# Patient Record
Sex: Female | Born: 2000 | Race: White | Hispanic: No | Marital: Single | Smoking: Never smoker
Health system: Southern US, Community
[De-identification: ages and names within clinical notes are randomized; demographics above are authoritative.]

## PROBLEM LIST (undated history)

## (undated) HISTORY — PX: APPENDECTOMY: SHX54

## (undated) NOTE — ED Provider Notes (Signed)
 Formatting of this note is different from the original.   EMERGENCY DEPARTMENT ENCOUNTER    CHIEF COMPLAINT   Chief Complaint  Patient presents with  ? 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 She reports she felt hot and then passed out falling forward. She denies ever having hx of same. Instructor reports she was unconscious for 30 seconds and her eyes were rolling back into her head during the episode.    HISTORY OF PRESENT ILLNESS   Kaitlyn Crosby is a 76 y.o. female who presents to the emergency department with syncopal episode.  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 ?  She states her vision blacked out and then she had a syncopal episode.  Patient denies any focal weakness or numbness.  No chest pain or shortness of breath.  She does not take birth control pills or use any hormonal therapy.  She has never had a syncopal episode previously.  No seizure activity.  She did strike her head on the ground/left side of the face.  No significant swelling or injury.  Loss of consciousness for about 30 seconds.  No neck pain.  She was otherwise healthy without any chronic medical conditions.  Asymptomatic at this time.  Patient states she only had yogurt and milk this morning for breakfast which is not unusual for her.  Left facial pain at the area of impact/gradual onset headache.  She denies any headache prior to the syncopal event.  PAST MEDICAL HISTORY   Past medical history and social history reviewed and verified by me.  History reviewed. No pertinent past medical history.  SURGICAL HISTORY   Past Surgical History:  Procedure Laterality Date  ? APPENDECTOMY     CURRENT MEDICATIONS   Current Facility-Administered Medications  Medication Dose Route Frequency Provider Last Rate Last Admin  ? sodium chloride (NS BOLUS) 0.9 % IV bolus 1,000 mL  1,000 mL  Intravenous Once Clotilda CHRISTELLA Hastings, MD       No current outpatient medications on file.   ALLERGIES   No Known Allergies  FAMILY HISTORY History reviewed. No pertinent family history.  SOCIAL HISTORY   Social History   Socioeconomic History  ? Marital status: Single    Spouse name: None  ? Number of children: None  ? Years of education: None  ? Highest education level: None  Occupational History  ? None  Tobacco Use  ? Smoking status: Never  ? Smokeless tobacco: Never  Vaping Use  ? Vaping Use: None  Substance and Sexual Activity  ? Alcohol use: Yes    Comment: occ  ? Drug use: Never  ? Sexual activity: None  Other Topics Concern  ? None  Social History Narrative  ? None   Social Determinants of Health   Financial Resource Strain: Not on file  Food Insecurity: Not on file  Transportation Needs: Not on file  Social Connections: Not on file  Housing Stability: Not on file   REVIEW OF SYSTEMS   Constitutional:  No fever or chills. Skin:  No rash.   Eyes:  No new vision problems or eye pain.   ENT:  No sore throat or nasal congestion. Cardiovascular:  No chest pain, no palpitations.   Respiratory:  No cough, shortness of breath, or wheezing.   Gastrointestinal:  No abdominal pain, vomiting, or diarrhea   Genitourinary: No  dysuria, no genital complaints.  Musculoskeletal: No lower extremity swelling.  No joint swelling or redness.  Neurologic:  Gradual onset headache after head impact.  No new focal weakness or numbness. Hematologic:  No unusual bruising or bleeding.    PHYSICAL EXAM   VITAL SIGNS:  BP 123/71 (BP Location: Right upper arm, Patient Position: Sitting)   Pulse 83   Temp 99.1 F (37.3 C) (Oral)   Resp 18   Ht 5' 8 (1.727 m)   Wt 175 lb (79.4 kg)   LMP 12/10/2022 (Approximate)   SpO2 100%   BMI 26.61 kg/m   Reviewed. Vital signs stable.  GENERAL:  Well-appearing, nontoxic, pleasant.  In no apparent pain.  No acute respiratory distress.   Ambulatory.  HEAD:  Mild soft tissue swelling of the left cheek without significant ecchymosis or crepitus.  NCAT, No signs of head trauma. EYES:  PERRL bilaterally.  No conjunctival injection.  EOMI.   EARS:  Hearing grossly intact.  MOUTH:  Oropharynx is normal.  No tonsillar enlargement or exudate.  No mucosal lesions.  Uvula midline.  Dry mucous membranes.  NOSE: Patent nares, no discharge. NECK:  Trachea midline.  Supple neck.  No LAD, no JVD, no thyroid enlargement.  No meningismus.   RESPIRATORY:  Good ventilation.  Lungs clear to ascultation bilaterally.  No wheezes, rales, or rhonchi.   CARDIAC:  Regular rate and rhythm.  S1 and S2, without murmurs, gallops, or rubs. VASCULAR:  Peripheral pulses normal, 2+, equal radial pulses. ABDOMEN:  Soft, nontender to palpation.  Benign abdomen.  Nondistended, no pulsatile mass.  No other masses palpated.  No rebound or guarding.   MUSCULOSKELETAL: Extremities without cyanosis.  No edema.  Bilateral calves symmetric and nontender.  No joint effusions. BACK: No CVA tenderness to palpation, no midline ttp of the thoracic or lumbar spine.   NEUROLOGIC EXAM:  Alert and oriented x 4.   No focal sensory or strength deficits.   Speech normal.  Follows commands.   Normal gait.  CN 2-12 intact bilaterally.  No pronator drift.  5/5 strength BUE, 5/5 strength BLE.  SILT throughout.  PSYCHIATRIC:  Affect normal.  No apparent hallucinations.   SKIN:  No rash or other skin lesions.  EKG: Interpreted and read by me. See MUSE documentation for details.   ECG Results     ECG 12 lead (Final result)     Collection Time Result Time Ventricular Rate Atrial Rate P-R Interval QRS Duration Q-T Interval   01/15/23 10:51:42 01/15/23 10:57:35 67 67 172 88 382    Collection Time Result Time QTC Calculation(Bezet) Calculated P Axis Calculated R Axis Calculated T Axis   01/15/23 10:51:42 01/15/23 10:57:35 403 40 90 56      Final result       Narrative:   Normal  sinus rhythm Rightward axis Borderline ECG No previous ECGs available NSR, incomplete RBBB -Stacy MD Confirmed by Glade Kirsch ([id number]) on 01/15/2023 10:57:30 AM          CARDIAC MONITOR: Patient was placed on cardiac monitor.  Independently interpreted as: Regular, rate in the 60s  LABORATORY & RADIOLOGY STUDIES: Results for orders placed or performed during the hospital encounter of 01/15/23  CBC w/o Differential Panel  Result Value Ref Range   WBC Count 7.8 4.0 - 10.0 K/uL   RBC Count 4.50 3.93 - 5.22 M/uL   Hemoglobin 12.9 11.2 - 15.7 g/dL   Hematocrit 60.0 65.8 - 44.9 %   MCV 88.7 79.4 -  94.8 fL   MCH 28.7 25.6 - 32.2 pg   MCHC 32.3 32.2 - 36.5 g/dL   RDW 86.1 88.3 - 85.5 %   Platelets 208 163 - 369 K/uL   MPV 10.6 9.4 - 12.4 fL   Abs NRBC 0.01 0.00 - 0.01 K/uL   NRBC 0.0 0.0 - 0.2 per 100 WBC's  Basic Metabolic Profile (BMP): Glucose, BUN, CO2-, Na+, K+, Cl-, Ca, SCr, Anion Gap  Result Value Ref Range   Sodium 139 136 - 145 mmol/L   Potassium 4.4 3.4 - 5.1 mmol/L   Chloride 103 98 - 107 mmol/L   Carbon Dioxide 30.4 20.0 - 31.0 mmol/L   Anion Gap 6    Creatinine 0.77 0.55 - 1.02 mg/dL   GFR, Estimated >=39 fO/fpw/8.26f7   Urea Nitrogen, Blood 15 9 - 23 mg/dL   Glucose 99 74 - 893 mg/dL   Calcium 9.3 8.7 - 89.5 mg/dL  Magnesium  Result Value Ref Range   Magnesium 2.2 1.6 - 2.6 mg/dL  POC Urine Pregnancy  Result Value Ref Range   POC Urine Pregnancy Test Negative - QC acceptable background clear Negative - QC acceptable background clear  ECG 12 lead  Result Value Ref Range   Ventricular Rate 67 BPM   Atrial Rate 67 BPM   P-R Interval 172 ms   QRS Duration 88 ms   Q-T Interval 382 ms   QTC Calculation(Bezet) 403 ms   Calculated P Axis 40 degrees   Calculated R Axis 90 degrees   Calculated T Axis 56 degrees   MEDICATIONS / TREATMENTS ADMINISTERED: Medications  sodium chloride (NS BOLUS) 0.9 % IV bolus 1,000 mL (has no administration in time range)    ED COURSE & MEDICAL DECISION MAKING:   This is a 65 y.o. 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  Typical prodrome.  Patient is neurologically intact.  Mild left facial soft tissue swelling, low suspicion fracture intracranial hemorrhage.  Urine basic labs ordered and she was given IV fluids.  EKG unremarkable.  Low suspicion PE or ectopic pregnancy.  All lab and radiologic studies were independently reviewed and interpreted by me.  Advanced imaging studies (i.e.: CT/US /MRI) interpreted by radiology.  Pertinent findings are as follows:  EKG unremarkable.  HCG negative.  Labs are normal.  Patient remained well-appearing and asymptomatic in the emergency department.  She does not require imaging for head injury at this time, low suspicion fracture or intracranial hemorrhage.  Patient agrees with this plan/shared decision-making.  She remained neurologically Intact.  She is stable for discharge home.  I educated her regarding supportive care, advised her to follow up with her primary doctor within 2-3 days for recheck.  She was given return precautions and agrees with plan for discharge.  DISCHARGE HOME:  The patient (and/or caregiver) was educated with regards to all aspects of care, including results of ED testing, prescription and over-the-counter medication use, and follow-up instructions.  The patient was encouraged to follow up with primary care as appropriate.  All of the patient's questions were answered.  The patient (and/or caregiver) agrees with plan for discharge and was given strict return precautions.  The patient will return to the emergency department immediately if any new or worsening symptoms, or if there are any additional concerns prior to following up with his/her primary care doctor.  Disposition:  DC home  FINAL IMPRESSION:   Final diagnoses:  Vasovagal syncope  Contusion of  face, initial  encounter   New Prescriptions   No medications on file   DIFFERENTIAL DIAGNOSIS:  I have considered but do not suspect at this time: Stroke, vascular dissection, acute coronary syndrome, pulmonary embolus, sepsis, dysrhythmia, myocarditis/endocarditis/pericarditis, acute valvular insufficiency, or other emergent condition. I have considered but do not suspect at this time: subarachnoid hemorrhage, intracranial hemorrhage, meningitis/ encephalitis, vascular dissection, cerebral venous sinus thrombosis, temporal arteritis, acute angle closure glaucoma, discitis, epidural abscess/ spinal infection, acute cerebrovascular accident, or other emergent neurologic condition.  .  Tests considered but not performed:  Head CT deferred under shared decision-making at this time.  Clotilda Hastings M.D.  Portions of this note have been dictated using voice recognition software.  Dictation errors are possible and unintentional.  Please excuse any typographical errors.  Please notify the dino if any discrepancies are noted or if the meaning of any statement is not clear.  Clotilda CHRISTELLA Hastings, MD 01/15/23 1144  Electronically signed by Clotilda CHRISTELLA Hastings, MD at 01/15/2023 11:44 AM EST

---
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
## 2021-05-09 ENCOUNTER — Ambulatory Visit: Payer: 59 | Admitting: Advanced Practice Midwife

## 2021-05-09 ENCOUNTER — Other Ambulatory Visit: Payer: Self-pay

## 2021-05-09 ENCOUNTER — Encounter: Payer: Self-pay | Admitting: Advanced Practice Midwife

## 2021-05-09 VITALS — BP 120/80 | Ht 68.0 in | Wt 169.0 lb

## 2021-05-09 DIAGNOSIS — Z3009 Encounter for other general counseling and advice on contraception: Secondary | ICD-10-CM | POA: Diagnosis not present

## 2021-05-10 NOTE — Progress Notes (Signed)
   Patient ID: Kaitlyn Crosby, female   DOB: [birth year], 20 y.o.   MRN: [id number]  Reason for Consult: Contraception (iud)    Subjective:  Date of Service: 05/09/2021  HPI:  Kaitlyn Crosby is a 20 y.o. female being seen to discuss birth/cycle control options. She is interested in a method that will lighten or stop menstrual periods. She normally has a painful period every 32 days lasting 5-6 days. The first 3 days are heavy. She changes a tampon every 4 hours. She is sexually active and currently uses condoms. She denies concern for STDs. She is a non-smoker. She is generally healthy and has no contraindications to IUD use.  We discussed the various methods and their potential side effects, etc. She chooses a hormonal IUD. I recommended the smaller Kyleena since she is a G0. Product specific information given. She will call when her next period starts.   History reviewed. No pertinent past medical history. History reviewed. No pertinent family history. Past Surgical History:  Procedure Laterality Date  . APPENDECTOMY      Short Social History:  Social History   Tobacco Use  . Smoking status: Never Smoker  . Smokeless tobacco: Never Used  Substance Use Topics  . Alcohol use: Never    Not on File  No current outpatient medications on file.   No current facility-administered medications for this visit.    Review of Systems  Constitutional: Negative for chills and fever.  HENT: Negative for congestion, ear discharge, ear pain, hearing loss, sinus pain and sore throat.   Eyes: Negative for blurred vision and double vision.  Respiratory: Negative for cough, shortness of breath and wheezing.   Cardiovascular: Negative for chest pain, palpitations and leg swelling.  Gastrointestinal: Negative for abdominal pain, blood in stool, constipation, diarrhea, heartburn, melena, nausea and vomiting.  Genitourinary: Negative for dysuria, flank pain, frequency, hematuria and urgency.   Musculoskeletal: Negative for back pain, joint pain and myalgias.  Skin: Negative for itching and rash.  Neurological: Negative for dizziness, tingling, tremors, sensory change, speech change, focal weakness, seizures, loss of consciousness, weakness and headaches.  Endo/Heme/Allergies: Negative for environmental allergies. Does not bruise/bleed easily.  Psychiatric/Behavioral: Negative for depression, hallucinations, memory loss, substance abuse and suicidal ideas. The patient is not nervous/anxious and does not have insomnia.         Objective:  Objective   Vitals:   05/09/21 1507  BP: 120/80  Weight: 169 lb (76.7 kg)  Height: 5\' 8"  (1.727 m)   Body mass index is 25.7 kg/m. Constitutional: Well nourished, well developed female in no acute distress.  HEENT: normal Skin: Warm and dry.  Cardiovascular: Regular rate and rhythm.   Extremity: no edema Respiratory: Clear to auscultation bilateral. Normal respiratory effort Neuro: DTRs 2+, Cranial nerves grossly intact Psych: Alert and Oriented x3. No memory deficits. Normal mood and affect.  MS: normal gait, normal bilateral lower extremity ROM/strength/stability.   Assessment/Plan:     20 y.o. G0 P0000 female birth control consult/desire to initiate hormonal birth control 12 IUD)  Call to schedule Geradine Girt IUD insertion when next period starts   Rutha Bouchard CNM [facility] 05/10/2021, 2:19 PM

## 2021-05-31 ENCOUNTER — Telehealth: Payer: Self-pay | Admitting: Advanced Practice Midwife

## 2021-05-31 NOTE — Telephone Encounter (Signed)
Pt is scheduled on 6/7 for Kyleena placement at 8:10 with Tresea Mall.

## 2021-05-31 NOTE — Telephone Encounter (Signed)
Pt aware appt next Tues will be fine.

## 2021-05-31 NOTE — Telephone Encounter (Signed)
Pt calling; wants IUD; started period today; couldn't get appt until next Tues; bleeding will be lighter then; will that be okay.  [phone]  [facility] - yes it will be fine.

## 2021-06-03 NOTE — Telephone Encounter (Signed)
Noted. Kyleena reserved for this patient. 

## 2021-06-04 ENCOUNTER — Encounter: Payer: Self-pay | Admitting: Advanced Practice Midwife

## 2021-06-04 ENCOUNTER — Ambulatory Visit: Payer: 59 | Admitting: Advanced Practice Midwife

## 2021-06-04 ENCOUNTER — Other Ambulatory Visit: Payer: Self-pay

## 2021-06-04 VITALS — BP 100/70 | Ht 68.0 in | Wt 173.0 lb

## 2021-06-04 DIAGNOSIS — Z538 Procedure and treatment not carried out for other reasons: Secondary | ICD-10-CM | POA: Diagnosis not present

## 2021-06-04 MED ORDER — MISOPROSTOL 200 MCG PO TABS: 400.0000 ug | ORAL_TABLET | Freq: Once | ORAL | 0 refills | Status: DC

## 2021-06-04 NOTE — Progress Notes (Signed)
   GYNECOLOGY OFFICE PROCEDURE NOTE  Kaitlyn Crosby is a 20 y.o. G0P0000 here for [facility] IUD insertion. No GYN concerns.  The patient is currently using none for contraception and is not currently sexually active. Her LMP is Patient's last menstrual period was 05/31/2021..  The indication for her IUD is contraception/cycle control.  Review of Systems  Constitutional: Negative for chills and fever.  HENT: Negative for congestion, ear discharge, ear pain, hearing loss, sinus pain and sore throat.   Eyes: Negative for blurred vision and double vision.  Respiratory: Negative for cough, shortness of breath and wheezing.   Cardiovascular: Negative for chest pain, palpitations and leg swelling.  Gastrointestinal: Negative for abdominal pain, blood in stool, constipation, diarrhea, heartburn, melena, nausea and vomiting.  Genitourinary: Negative for dysuria, flank pain, frequency, hematuria and urgency.  Musculoskeletal: Negative for back pain, joint pain and myalgias.  Skin: Negative for itching and rash.  Neurological: Negative for dizziness, tingling, tremors, sensory change, speech change, focal weakness, seizures, loss of consciousness, weakness and headaches.  Endo/Heme/Allergies: Negative for environmental allergies. Does not bruise/bleed easily.  Psychiatric/Behavioral: Negative for depression, hallucinations, memory loss, substance abuse and suicidal ideas. The patient is not nervous/anxious and does not have insomnia.      IUD Insertion Procedure Note Patient identified, informed consent performed, consent signed.   Discussed risks of irregular bleeding, cramping, infection, malpositioning, expulsion or uterine perforation of the IUD (1:1000 placements)  which may require further procedure such as laparoscopy.  IUD while effective at preventing pregnancy do not prevent transmission of sexually transmitted diseases and use of barrier methods for this purpose was discussed. Time out was  performed.  Urine pregnancy test negative.  Speculum placed in the vagina.  Cervix visualized.  Cleaned with Betadine x 2.  Grasped posteriorly with a single tooth tenaculum.  Unable to pass uterine sound through cervix. Tenaculum was removed.  Patient tolerated procedure well.   Discussed options for birth/cycle control including other hormonal methods and attempting to insert IUD at a future visit after taking cytotec. The patient chooses to attempt insertion again.  Rx cytotec 400 mcg once, 3-4 hours prior to procedure Return to office at your convenience for IUD insertion   Tresea Mall, CNM [facility] 06/04/2021, 10:24 AM

## 2021-07-02 ENCOUNTER — Telehealth: Payer: Self-pay | Admitting: Advanced Practice Midwife

## 2021-07-02 NOTE — Telephone Encounter (Signed)
Patient coming in on 07/03/2021 in [place] with JEG for Kyleena insertion.

## 2021-07-03 ENCOUNTER — Ambulatory Visit: Payer: 59 | Admitting: Advanced Practice Midwife

## 2021-07-03 ENCOUNTER — Other Ambulatory Visit: Payer: Self-pay

## 2021-07-03 ENCOUNTER — Encounter: Payer: Self-pay | Admitting: Advanced Practice Midwife

## 2021-07-03 ENCOUNTER — Telehealth: Payer: Self-pay

## 2021-07-03 VITALS — BP 118/70 | Ht 68.0 in | Wt 171.0 lb

## 2021-07-03 DIAGNOSIS — Z538 Procedure and treatment not carried out for other reasons: Secondary | ICD-10-CM | POA: Diagnosis not present

## 2021-07-03 NOTE — Telephone Encounter (Signed)
Pt calling; was seen earlier today for second attempt at inserting IUD; wants to talk to a doctor about it; something about only measured to 4cm; doesn't know if cx wouldn't open up or what.  [phone]

## 2021-07-04 NOTE — Progress Notes (Addendum)
Patient ID: Kaitlyn Crosby, female   DOB: 02/22/2001, 20 y.o.   MRN: [id number]  Reason for Consult: Contraception    Subjective:  Date of Service: 07/03/2021  HPI:  Kaitlyn Crosby is a 20 y.o. female being seen for second attempt to insert Kyleena IUD. Patient is currently on her period and she took the prescribed dose of misoprostol this morning around 6 am. Despite these conditions, attempt to sound uterus was again unsuccessful. Only able to pass sound to 4.5 cm before meeting resistance. Several attempts were made to pass sound. Likely short or anteverted uterus.  History reviewed. No pertinent past medical history. History reviewed. No pertinent family history. Past Surgical History:  Procedure Laterality Date   APPENDECTOMY      Short Social History:  Social History   Tobacco Use   Smoking status: Never   Smokeless tobacco: Never  Substance Use Topics   Alcohol use: Never    No Known Allergies  Current Outpatient Medications  Medication Sig Dispense Refill   misoprostol (CYTOTEC) 200 MCG tablet Take 2 tablets (400 mcg total) by mouth once for 1 dose. 3 to 4 hours prior to procedure 2 tablet 0   No current facility-administered medications for this visit.   Review of Systems  Constitutional:  Negative for chills and fever.  HENT:  Negative for congestion, ear discharge, ear pain, hearing loss, sinus pain and sore throat.   Eyes:  Negative for blurred vision and double vision.  Respiratory:  Negative for cough, shortness of breath and wheezing.   Cardiovascular:  Negative for chest pain, palpitations and leg swelling.  Gastrointestinal:  Negative for abdominal pain, blood in stool, constipation, diarrhea, heartburn, melena, nausea and vomiting.  Genitourinary:  Negative for dysuria, flank pain, frequency, hematuria and urgency.  Musculoskeletal:  Negative for back pain, joint pain and myalgias.  Skin:  Negative for itching and rash.  Neurological:  Negative for  dizziness, tingling, tremors, sensory change, speech change, focal weakness, seizures, loss of consciousness, weakness and headaches.  Endo/Heme/Allergies:  Negative for environmental allergies. Does not bruise/bleed easily.  Psychiatric/Behavioral:  Negative for depression, hallucinations, memory loss, substance abuse and suicidal ideas. The patient is not nervous/anxious and does not have insomnia.     Objective:  Objective   Vitals:   07/03/21 0916  BP: 118/70  Weight: 171 lb (77.6 kg)  Height: 5\' 8"  (1.727 m)   Body mass index is 26 kg/m. Constitutional: Well nourished, well developed female in no acute distress.  HEENT: normal Skin: Warm and dry.  Cardiovascular: Regular rate and rhythm.   Extremity:  no edema   Respiratory: Clear to auscultation bilateral. Normal respiratory effort Neuro: DTRs 2+, Cranial nerves grossly intact Psych: Alert and Oriented x3. No memory deficits. Normal mood and affect.  MS: normal gait, normal bilateral lower extremity ROM/strength/stability.  Pelvic exam: (female chaperone present) is not limited by body habitus EGBUS: within normal limits Vagina: within normal limits and with normal mucosa, blood in the vault Cervix: normal appearance  Sterile speculum placed with good visualization of cervix. Cleaned with betadine x 2. Sterile tenaculum placed posteriorly. Uterine sound resistance at 4.5 cm. Patient describes pain with attempt at 8/10. Procedure discontinued due to being unable to sound uterus to sufficient depth and to patient discomfort.   Discussed other birth control options including pills, patch, ring, injection, Nexplanon. Patient is not interested in other forms of hormonal birth control at this time and plans to continue using condoms. Also suggested she could  speak with one of the MDs for further information or diagnostics related to failed IUD attempts. Consider ultrasound for analysis of uterus. Apologies given for failure to place  IUD successfully.   Assessment/Plan:     20 y.o. 2nd failed attempt to place [place] IUD  Return to clinic as needed for other desired contraception or to consult with MD regarding potential for IUD    Tresea Mall CNM [facility] 07/04/2021, 10:55 AM

## 2021-07-05 ENCOUNTER — Telehealth: Payer: Self-pay | Admitting: Obstetrics and Gynecology

## 2021-07-05 NOTE — Telephone Encounter (Signed)
Called patient and discussed some of the technical difficulties that could arise with IUD placement.  Discussed that I could perform a quick bedside ultrasound to see if her uterus has a strong flexion or version that would make placement difficult.  We could also make uterine measurements, as she is worried that she has a very small uterus based on the surrounding distance with the prior to attempts. She said she would likely come in and at least find this information out and then possibly decide later about having an IUD placed given that information.

## 2021-07-09 NOTE — Telephone Encounter (Signed)
Noted. Kaitlyn Crosby was not placed d/t Uterus only sound to 4.5cm. Patient may return for apt w/SDJ for bedside u/s to check position/size of uterus and decide on [facility] options.

## 2021-12-03 NOTE — Telephone Encounter (Signed)
06/04/2021- Unable to pass uterine sound through cervix. Discussed options for birth/cycle control including other hormonal methods and attempting to insert IUD at a future visit after taking cytotec. The patient chooses to attempt insertion again.   Rx cytotec 400 mcg once, 3-4 hours prior to procedure Return to office at your convenience for IUD insertion

## 2024-09-02 ENCOUNTER — Other Ambulatory Visit (HOSPITAL_COMMUNITY)
Admission: RE | Admit: 2024-09-02 | Discharge: 2024-09-02 | Disposition: A | Discharge status: Home / Self Care | Source: Ambulatory Visit

## 2024-09-02 ENCOUNTER — Ambulatory Visit (INDEPENDENT_AMBULATORY_CARE_PROVIDER_SITE_OTHER)

## 2024-09-02 VITALS — BP 111/76 | HR 68 | Resp 14 | Ht 66.0 in | Wt 189.0 lb

## 2024-09-02 DIAGNOSIS — N898 Other specified noninflammatory disorders of vagina: Secondary | ICD-10-CM | POA: Diagnosis present

## 2024-09-02 DIAGNOSIS — Z1159 Encounter for screening for other viral diseases: Secondary | ICD-10-CM | POA: Diagnosis not present

## 2024-09-02 DIAGNOSIS — Z114 Encounter for screening for human immunodeficiency virus [HIV]: Secondary | ICD-10-CM

## 2024-09-02 DIAGNOSIS — R109 Unspecified abdominal pain: Secondary | ICD-10-CM | POA: Insufficient documentation

## 2024-09-02 LAB — POCT URINALYSIS DIPSTICK
Bilirubin, UA: NEGATIVE
Glucose, UA: NEGATIVE
Ketones, UA: NEGATIVE
Leukocytes, UA: NEGATIVE
Nitrite, UA: NEGATIVE
Protein, UA: POSITIVE — AB
Spec Grav, UA: 1.015 (ref 1.010–1.025)
Urobilinogen, UA: 0.2 U/dL
pH, UA: 7 (ref 5.0–8.0)

## 2024-09-02 NOTE — Progress Notes (Signed)
 New patient visit  Patient: Kaitlyn Crosby   DOB: Oct 07, 2001   22 y.o. Female  MRN: [id number] Visit Date: 09/02/2024  Today's healthcare provider: Isaiah DELENA Pepper, MD   Chief Complaint  Patient presents with   Establish Care    Last PCP has not had one Decline vaccines   Flank Pain    Bilateral flank pain x 2 weeks  Was seen [facility] was given Keflex for 7 days TID did not help.   Subjective    Kaitlyn Crosby is a 23 y.o. female who presents today as a new patient to establish care.   Flank pain: - started 2 weeks ago - started on R side, then went to L side - took 7 days of Keflex for c/f UTI - Still having dull pain, rates it 1-2  - Denies fevers, chills, dysuria, hematuria, N/V  - denies any new activities - is sexually active, uses condoms - LMP: August 1, period starting now - Has Bms 1x daily, typically soft - Does report increased vaginal discharge, occasional pelvic pain  History reviewed. No pertinent past medical history. Past Surgical History:  Procedure Laterality Date   APPENDECTOMY     No family status information on file.   History reviewed. No pertinent family history. Social History   Socioeconomic History   Marital status: Single    Spouse name: Not on file   Number of children: Not on file   Years of education: Not on file   Highest education level: Not on file  Occupational History   Not on file  Tobacco Use   Smoking status: Never   Smokeless tobacco: Never  Vaping Use   Vaping status: Never Used  Substance and Sexual Activity   Alcohol use: Yes   Drug use: Never   Sexual activity: Yes    Birth control/protection: None  Other Topics Concern   Not on file  Social History Narrative   Not on file   Social Drivers of Health   Financial Resource Strain: Low Risk  (09/02/2024)   Overall Financial Resource Strain (CARDIA)    Difficulty of Paying Living Expenses: Not hard at all  Food Insecurity: No Food Insecurity (09/02/2024)    Hunger Vital Sign    Worried About Running Out of Food in the Last Year: Never true    Ran Out of Food in the Last Year: Never true  Transportation Needs: No Transportation Needs (09/02/2024)   PRAPARE - Administrator, Civil Service (Medical): No    Lack of Transportation (Non-Medical): No  Physical Activity: Sufficiently Active (09/02/2024)   Exercise Vital Sign    Days of Exercise per Week: 5 days    Minutes of Exercise per Session: 70 min  Stress: No Stress Concern Present (09/02/2024)   [employer] of Occupational Health - Occupational Stress Questionnaire    Feeling of Stress: Not at all  Social Connections: Not on file   Outpatient Medications Prior to Visit  Medication Sig   [DISCONTINUED] misoprostol  (CYTOTEC ) 200 MCG tablet Take 2 tablets (400 mcg total) by mouth once for 1 dose. 3 to 4 hours prior to procedure   No facility-administered medications prior to visit.   No Known Allergies  Reviews of Systems as noted in HPI.      Objective    BP 111/76   Pulse 68   Resp 14   Ht 5' 6 (1.676 m)   Wt 189 lb (85.7 kg)   LMP 07/29/2024  SpO2 100%   BMI 30.51 kg/m     Physical Exam Constitutional:      Appearance: Normal appearance.  HENT:     Head: Normocephalic and atraumatic.     Mouth/Throat:     Mouth: Mucous membranes are moist.  Eyes:     Pupils: Pupils are equal, round, and reactive to light.  Pulmonary:     Effort: Pulmonary effort is normal.  Abdominal:     General: Abdomen is flat. Bowel sounds are normal. There is no distension.     Palpations: Abdomen is soft.     Tenderness: There is no abdominal tenderness. There is no right CVA tenderness, left CVA tenderness, guarding or rebound.  Skin:    General: Skin is warm.  Neurological:     General: No focal deficit present.     Mental Status: She is alert.     Depression Screen    09/02/2024    2:56 PM 09/02/2024    2:55 PM  PHQ 2/9 Scores  PHQ - 2 Score 1 0  PHQ- 9 Score 1     Results for orders placed or performed in visit on 09/02/24  POCT Urinalysis Dipstick  Result Value Ref Range   Color, UA Yellow    Clarity, UA Clear    Glucose, UA Negative Negative   Bilirubin, UA Negative    Ketones, UA Negative    Spec Grav, UA 1.015 1.010 - 1.025   Blood, UA Small (A)    pH, UA 7.0 5.0 - 8.0   Protein, UA Positive (A) Negative   Urobilinogen, UA 0.2 0.2 or 1.0 E.U./dL   Nitrite, UA Negative    Leukocytes, UA Negative Negative   Appearance     Odor      Assessment & Plan      Problem List Items Addressed This Visit       Other   Bilateral flank pain - Primary   Patient with 2 week hx of flank pain that initially started on the right side but now present bilaterally. Diagn[facility] with [facility] and took Keflex x 7 days, however urine culture came back negative. POC urine done today + small blood, but just started menstrual period. DDX includes MSK etiology, renal disease, GI referred pain. Less likely pyelo given negative urine culture. - Will obtain labwork as below to evaluate for infection, renal disease - Will follow up results with patient      Relevant Orders   CBC   Basic metabolic panel with GFR   POCT Urinalysis Dipstick (Completed)   Vaginal discharge   Patient with increased vaginal discharge and occasional lower abdominal pain. May be related to flank pain. Will test for BV, G/C, trichomonas, yeast.      Relevant Orders   Cervicovaginal ancillary only   Other Visit Diagnoses       Screening for HIV (human immunodeficiency virus)       Relevant Orders   HIV Antibody (routine testing w rflx)     Need for hepatitis C screening test       Relevant Orders   Hepatitis C antibody       Return in about 3 weeks (around 09/23/2024) for Pap smear.     Isaiah DELENA Pepper, MD  [facility] [phone] (phone) [phone] (fax)

## 2024-09-02 NOTE — Assessment & Plan Note (Addendum)
 Patient with 2 week hx of flank pain that initially started on the right side but now present bilaterally. Diagnosed with UTI at [facility] and took Keflex x 7 days, however urine culture came back negative. POC urine done today + small blood, but just started menstrual period. DDX includes MSK etiology, renal disease, GI referred pain. Less likely pyelo given negative urine culture. - Will obtain labwork as below to evaluate for infection, renal disease - Will follow up results with patient

## 2024-09-02 NOTE — Assessment & Plan Note (Signed)
 Patient with increased vaginal discharge and occasional lower abdominal pain. May be related to flank pain. Will test for BV, G/C, trichomonas, yeast.

## 2024-09-03 LAB — BASIC METABOLIC PANEL WITH GFR
BUN/Creatinine Ratio: 11 (ref 9–23)
BUN: 11 mg/dL (ref 6–20)
CO2: 22 mmol/L (ref 20–29)
Calcium: 9.7 mg/dL (ref 8.7–10.2)
Chloride: 101 mmol/L (ref 96–106)
Creatinine, Ser: 0.98 mg/dL (ref 0.57–1.00)
Glucose: 84 mg/dL (ref 70–99)
Potassium: 4.3 mmol/L (ref 3.5–5.2)
Sodium: 138 mmol/L (ref 134–144)
eGFR: 84 mL/min/1.73 (ref >59)

## 2024-09-03 LAB — HEPATITIS C ANTIBODY: Hep C Virus Ab: NONREACTIVE

## 2024-09-03 LAB — CBC
Hematocrit: 37.7 % (ref 34.0–46.6)
Hemoglobin: 12.4 g/dL (ref 11.1–15.9)
MCH: 29.4 pg (ref 26.6–33.0)
MCHC: 32.9 g/dL (ref 31.5–35.7)
MCV: 89 fL (ref 79–97)
Platelets: 238 x10E3/uL (ref 150–450)
RBC: 4.22 x10E6/uL (ref 3.77–5.28)
RDW: 12.5 % (ref 11.7–15.4)
WBC: 7.3 x10E3/uL (ref 3.4–10.8)

## 2024-09-03 LAB — HIV ANTIBODY (ROUTINE TESTING W REFLEX): HIV Screen 4th Generation wRfx: NONREACTIVE

## 2024-09-05 ENCOUNTER — Ambulatory Visit: Payer: Self-pay

## 2024-09-06 LAB — CERVICOVAGINAL ANCILLARY ONLY
Bacterial Vaginitis (gardnerella): NEGATIVE
Candida Glabrata: NEGATIVE
Candida Vaginitis: NEGATIVE
Chlamydia: NEGATIVE
Comment: NEGATIVE
Comment: NEGATIVE
Comment: NEGATIVE
Comment: NEGATIVE
Comment: NEGATIVE
Comment: NORMAL
Neisseria Gonorrhea: NEGATIVE
Trichomonas: NEGATIVE

## 2024-09-22 ENCOUNTER — Other Ambulatory Visit (HOSPITAL_COMMUNITY): Admission: RE | Admit: 2024-09-22 | Discharge: 2024-09-22 | Disposition: A | Source: Ambulatory Visit

## 2024-09-22 ENCOUNTER — Ambulatory Visit

## 2024-09-22 VITALS — BP 116/67 | HR 80 | Wt 191.0 lb

## 2024-09-22 DIAGNOSIS — Z124 Encounter for screening for malignant neoplasm of cervix: Secondary | ICD-10-CM | POA: Insufficient documentation

## 2024-09-22 NOTE — Progress Notes (Signed)
      Established patient visit   Patient: Kaitlyn Crosby   DOB: 2001/07/14   23 y.o. Female  MRN: [id number] Visit Date: 09/22/2024  Today's healthcare provider: Isaiah DELENA Pepper, MD   Chief Complaint  Patient presents with   Gynecologic Exam   Subjective    HPI   No concerns, here for pap smear   Medications: No outpatient medications prior to visit.   No facility-administered medications prior to visit.    Review of Systems as noted in HPI.      Objective    BP 116/67 (BP Location: Left Arm, Patient Position: Sitting, Cuff Size: Normal)   Pulse 80   Wt 191 lb (86.6 kg)   LMP 09/03/2024   SpO2 100%   BMI 30.83 kg/m    Physical Exam Exam conducted with a chaperone present.  Constitutional:      Appearance: Normal appearance.  HENT:     Head: Normocephalic and atraumatic.     Mouth/Throat:     Mouth: Mucous membranes are moist.  Eyes:     Pupils: Pupils are equal, round, and reactive to light.  Pulmonary:     Effort: Pulmonary effort is normal.  Genitourinary:    General: Normal vulva.     Vagina: Normal.     Cervix: No cervical motion tenderness or friability.     Comments: Cervical ectropion present. Skin:    General: Skin is warm.  Neurological:     General: No focal deficit present.     Mental Status: She is alert.      No results found for any visits on 09/22/24.  Assessment & Plan     Problem List Items Addressed This Visit       Other   Screening for cervical cancer - Primary   Pap smear performed. Will communicate results with patient.      Relevant Orders   Cytology - PAP     No follow-ups on file.       Isaiah DELENA Pepper, MD  [facility] [phone] (phone) [phone] (fax)

## 2024-09-22 NOTE — Assessment & Plan Note (Signed)
 Pap smear performed. Will communicate results with patient.

## 2024-09-27 ENCOUNTER — Ambulatory Visit: Payer: Self-pay

## 2024-09-27 LAB — CYTOLOGY - PAP: Diagnosis: NEGATIVE
